# Patient Record
Sex: Female | Born: 1965 | Race: White | Hispanic: No | Marital: Married | Smoking: Never smoker
Health system: Southern US, Community
[De-identification: ages and names within clinical notes are randomized; demographics above are authoritative.]

## PROBLEM LIST (undated history)

## (undated) DIAGNOSIS — R351 Nocturia: Secondary | ICD-10-CM

## (undated) DIAGNOSIS — N393 Stress incontinence (female) (male): Secondary | ICD-10-CM

## (undated) DIAGNOSIS — G971 Other reaction to spinal and lumbar puncture: Secondary | ICD-10-CM

## (undated) DIAGNOSIS — E785 Hyperlipidemia, unspecified: Secondary | ICD-10-CM

## (undated) DIAGNOSIS — I10 Essential (primary) hypertension: Secondary | ICD-10-CM

## (undated) DIAGNOSIS — J189 Pneumonia, unspecified organism: Secondary | ICD-10-CM

## (undated) DIAGNOSIS — D649 Anemia, unspecified: Secondary | ICD-10-CM

## (undated) DIAGNOSIS — J302 Other seasonal allergic rhinitis: Secondary | ICD-10-CM

## (undated) DIAGNOSIS — G43909 Migraine, unspecified, not intractable, without status migrainosus: Secondary | ICD-10-CM

## (undated) DIAGNOSIS — R011 Cardiac murmur, unspecified: Secondary | ICD-10-CM

## (undated) HISTORY — PX: VAGINAL HYSTERECTOMY: SUR661

## (undated) HISTORY — PX: HERNIA REPAIR: SHX51

## (undated) HISTORY — PX: BLADDER SUSPENSION: SHX72

---

## 1987-12-05 DIAGNOSIS — J189 Pneumonia, unspecified organism: Secondary | ICD-10-CM

## 1987-12-05 HISTORY — DX: Pneumonia, unspecified organism: J18.9

## 2001-04-05 HISTORY — PX: TUBAL LIGATION: SHX77

## 2015-10-29 ENCOUNTER — Other Ambulatory Visit (HOSPITAL_COMMUNITY): Payer: Self-pay | Admitting: General Surgery

## 2015-10-29 DIAGNOSIS — K439 Ventral hernia without obstruction or gangrene: Secondary | ICD-10-CM

## 2015-11-06 ENCOUNTER — Ambulatory Visit (HOSPITAL_COMMUNITY)
Admission: RE | Admit: 2015-11-06 | Discharge: 2015-11-06 | Disposition: A | Discharge status: Home / Self Care | Payer: BC Managed Care – PPO | Source: Ambulatory Visit | Attending: General Surgery | Admitting: General Surgery

## 2015-11-06 ENCOUNTER — Encounter (HOSPITAL_COMMUNITY): Payer: Self-pay | Admitting: Radiology

## 2015-11-06 DIAGNOSIS — K439 Ventral hernia without obstruction or gangrene: Secondary | ICD-10-CM | POA: Insufficient documentation

## 2015-11-06 MED ORDER — IOPAMIDOL (ISOVUE-300) INJECTION 61%
100.0000 mL | Freq: Once | INTRAVENOUS | Status: AC | PRN
  Administered 2015-11-06: 100 mL via INTRAVENOUS

## 2016-01-27 ENCOUNTER — Ambulatory Visit: Payer: Self-pay | Admitting: General Surgery

## 2016-01-27 NOTE — H&P (Signed)
Sarah MedicusMarilyn A. Earl Ortega 01/27/2016 2:00 PM Location: [facility] Patient #: [id number] DOB: 04/10/1965 Married / Language: Undefined / Race: Refused to Report/Unreported Female  History of Present Illness Adolph Pollack(Sarah Baskerville J. Henok Heacock MD; 01/27/2016 2:34 PM) The patient is a 50 year old female.   Note:She presents for follow-up visit of her symptomatic and Procardia. The discomfort and symptoms are increasing. She has no obstructive type symptoms. She does not think she can wait until the summer to have the surgery. She is a Chartered loss adjusterschoolteacher. She would like to go ahead and schedule the surgery.  Allergies (April Staton, CMA; 01/27/2016 2:00 PM) Codeine Phosphate *ANALGESICS - OPIOID* Rapid pulse.  Medication History (April Staton, CMA; 01/27/2016 2:00 PM) AmLODIPine Besylate (10MG  Tablet, Oral) Active. Atenolol (25MG  Tablet, Oral) Active. Atorvastatin Calcium (10MG  Tablet, Oral) Active. Cetirizine HCl (10MG  Tablet, Oral) Active. Iron (65MG  Tablet, Oral) Active. Multiple Vitamin (Oral) Active. Calcium (Oral) Specific dose unknown - Active. Medications Reconciled     Review of Systems Adolph Pollack(Dawood Spitler J. Monico Sudduth MD; 01/27/2016 2:34 PM)  Note: No signs of intestinal obstruction.  She does have a borderline hemoglobin A1c level has a family history of diabetes. She has not been diagnosed with diabetes.   Vitals (April Staton CMA; 01/27/2016 2:01 PM) 01/27/2016 2:00 PM Weight: 210.5 lb Height: 64in Height was reported by patient. Body Surface Area: 2 m Body Mass Index: 36.13 kg/m  Temp.: 98.88F(Oral)  Pulse: 75 (Regular)  P.OX: 94% (Room air) BP: 128/90 (Sitting, Left Arm, Standard)      Physical Exam Adolph Pollack(Kasidi Shanker J. Jacai Kipp MD; 01/27/2016 2:36 PM)  The physical exam findings are as follows: Note:General: Obese female in NAD. Pleasant and cooperative.  HEENT: [place]/AT, no external nasal or ear masses  EYES: Wears glasses  CV: RRR, no murmur, no  edema  CHEST: Breath sounds equal and clear. Respirations nonlabored.  ABDOMEN: Soft, nontender, nondistended, lower transverse scar, superior to the scar there is a obvious bulge that is partially reducible.  SKIN: No jaundice.  NEUROLOGIC: Alert and oriented, answers questions appropriately, normal gait and station.  PSYCHIATRIC: Normal mood, affect , and behavior.    Assessment & Plan Adolph Pollack(Lexander Tremblay J. Onnie Alatorre MD; 01/27/2016 2:33 PM)  VENTRAL HERNIA WITHOUT OBSTRUCTION OR GANGRENE (K43.9) Impression: With her increasing discomfort, she does not think she can wait until the summer and would like to schedule the surgery.  Plan: Laparoscopic possible open ventral hernia repair with mesh. I have discussed the procedure, risks, and aftercare. Risks include but are not limited to bleeding, infection, wound healing problems, anesthesia, recurrence, accidental injury to intra-abdominal organs-such as intestine, liver, spleen, bladder, etc. We also discussed the rare complication of mesh rejection. All questions were answered.  Avel Peaceodd Tallula Grindle, M.D.

## 2016-01-28 ENCOUNTER — Ambulatory Visit: Payer: Self-pay | Admitting: General Surgery

## 2016-02-04 ENCOUNTER — Ambulatory Visit: Payer: Self-pay | Admitting: General Surgery

## 2016-03-04 ENCOUNTER — Encounter (HOSPITAL_COMMUNITY): Payer: Self-pay

## 2016-03-04 ENCOUNTER — Encounter (HOSPITAL_COMMUNITY)
Admission: RE | Admit: 2016-03-04 | Discharge: 2016-03-04 | Disposition: A | Discharge status: Home / Self Care | Payer: BC Managed Care – PPO | Source: Ambulatory Visit | Attending: General Surgery | Admitting: General Surgery

## 2016-03-04 DIAGNOSIS — Z01812 Encounter for preprocedural laboratory examination: Secondary | ICD-10-CM | POA: Diagnosis not present

## 2016-03-04 DIAGNOSIS — Z01818 Encounter for other preprocedural examination: Secondary | ICD-10-CM | POA: Insufficient documentation

## 2016-03-04 DIAGNOSIS — Z01810 Encounter for preprocedural cardiovascular examination: Secondary | ICD-10-CM | POA: Insufficient documentation

## 2016-03-04 DIAGNOSIS — K439 Ventral hernia without obstruction or gangrene: Secondary | ICD-10-CM | POA: Diagnosis not present

## 2016-03-04 DIAGNOSIS — R001 Bradycardia, unspecified: Secondary | ICD-10-CM | POA: Insufficient documentation

## 2016-03-04 HISTORY — DX: Essential (primary) hypertension: I10

## 2016-03-04 HISTORY — DX: Other reaction to spinal and lumbar puncture: G97.1

## 2016-03-04 HISTORY — DX: Hyperlipidemia, unspecified: E78.5

## 2016-03-04 HISTORY — DX: Nocturia: R35.1

## 2016-03-04 HISTORY — DX: Stress incontinence (female) (male): N39.3

## 2016-03-04 HISTORY — DX: Pneumonia, unspecified organism: J18.9

## 2016-03-04 HISTORY — DX: Anemia, unspecified: D64.9

## 2016-03-04 LAB — COMPREHENSIVE METABOLIC PANEL
ALBUMIN: 4.3 g/dL (ref 3.5–5.0)
ALK PHOS: 112 U/L (ref 38–126)
ALT: 44 U/L (ref 14–54)
AST: 34 U/L (ref 15–41)
Anion gap: 8 (ref 5–15)
BUN: 12 mg/dL (ref 6–20)
CALCIUM: 9.5 mg/dL (ref 8.9–10.3)
CO2: 27 mmol/L (ref 22–32)
CREATININE: 0.74 mg/dL (ref 0.44–1.00)
Chloride: 104 mmol/L (ref 101–111)
GFR calc Af Amer: >60 mL/min (ref >60)
GLUCOSE: 99 mg/dL (ref 65–99)
Potassium: 4.1 mmol/L (ref 3.5–5.1)
Sodium: 139 mmol/L (ref 135–145)
TOTAL PROTEIN: 6.8 g/dL (ref 6.5–8.1)
Total Bilirubin: 0.4 mg/dL (ref 0.3–1.2)

## 2016-03-04 LAB — CBC WITH DIFFERENTIAL/PLATELET
Basophils Absolute: 0.1 10*3/uL (ref 0.0–0.1)
Basophils Relative: 1 %
Eosinophils Absolute: 0.1 10*3/uL (ref 0.0–0.7)
Eosinophils Relative: 2 %
HCT: 41.7 % (ref 36.0–46.0)
Hemoglobin: 13.8 g/dL (ref 12.0–15.0)
Lymphocytes Relative: 32 %
Lymphs Abs: 2.1 10*3/uL (ref 0.7–4.0)
MCH: 29.6 pg (ref 26.0–34.0)
MCHC: 33.1 g/dL (ref 30.0–36.0)
MCV: 89.5 fL (ref 78.0–100.0)
Monocytes Absolute: 0.7 10*3/uL (ref 0.1–1.0)
Monocytes Relative: 11 %
Neutro Abs: 3.7 10*3/uL (ref 1.7–7.7)
Neutrophils Relative %: 54 %
Platelets: 348 10*3/uL (ref 150–400)
RBC: 4.66 MIL/uL (ref 3.87–5.11)
RDW: 12.9 % (ref 11.5–15.5)
WBC: 6.8 10*3/uL (ref 4.0–10.5)

## 2016-03-04 MED ORDER — CHLORHEXIDINE GLUCONATE CLOTH 2 % EX PADS: 6.0000 | MEDICATED_PAD | Freq: Once | CUTANEOUS | Status: DC

## 2016-03-04 NOTE — Progress Notes (Addendum)
Cardiologist denies  Medical Md is Dr.Vyas  Echo done over 25 yrs ago  Stress test denies  Heart cath denies  EKG denies in past yr  CXR denies in past yr

## 2016-03-04 NOTE — Pre-Procedure Instructions (Signed)
April HoldingMarilyn Whitt  03/04/2016      [facility] - [place] - [address] [place] [id number] Phone: [phone] Fax: [phone]  [facility] [place] - [id number] AT Portal to [facility] [address] [place] [id number] Phone: [phone] Fax: [phone]    Your procedure is scheduled on Mon, Dec 4 @ 11:00 AM  [facility] Admitting at 9:00 AM  Call this number if you have problems the morning of surgery:  [phone]   Remember:  Do not eat food or drink liquids after midnight.  Take these medicines the morning of surgery with A SIP OF WATER Amlodipine(Norvasc),Atenolol(Tenormin),and Zyrtec(Cetirizine)              No Goody's,BC's,Aleve,Advil,Motrin,Ibuprofen,Fish Oil,or any Herbal Medications.    Do not wear jewelry, make-up or nail polish.  Do not wear lotions, powders, perfumes, or deoderant.  Do not shave 48 hours prior to surgery.    Do not bring valuables to the hospital.  [facility] is not responsible for any belongings or valuables.  Contacts, dentures or bridgework may not be worn into surgery.  Leave your suitcase in the car.  After surgery it may be brought to your room.  For patients admitted to the hospital, discharge time will be determined by your treatment team.  Patients discharged the day of surgery will not be allowed to drive home.    [facility]  Before surgery, you can play an important role.  Because skin is not sterile, your skin needs to be as free of germs as possible.  You can reduce the number of germs on you skin by washing with CHG (chlorahexidine gluconate) soap before surgery.  CHG is an antiseptic cleaner which kills germs and bonds with the skin to continue killing germs even after washing.  Please DO NOT use if you have an allergy to CHG or antibacterial soaps.  If your skin  becomes reddened/irritated stop using the CHG and inform your nurse when you arrive at Short Stay.  Do not shave (including legs and underarms) for at least 48 hours prior to the first CHG shower.  You may shave your face.  Please follow these instructions carefully:   1.  Shower with CHG Soap the night before surgery and the                                morning of Surgery.  2.  If you choose to wash your hair, wash your hair first as usual with your       normal shampoo.  3.  After you shampoo, rinse your hair and body thoroughly to remove the                      Shampoo.  4.  Use CHG as you would any other liquid soap.  You can apply chg directly       to the skin and wash gently with scrungie or a clean washcloth.  5.  Apply the CHG Soap to your body ONLY FROM THE NECK DOWN.        Do not use on open wounds or open sores.  Avoid contact with your eyes,       ears, mouth and genitals (private parts).  Wash genitals (private parts)       with your normal soap.  6.  Wash thoroughly, paying special attention to the area where your surgery        will be performed.  7.  Thoroughly rinse your body with warm water from the neck down.  8.  DO NOT shower/wash with your normal soap after using and rinsing off       the CHG Soap.  9.  Pat yourself dry with a clean towel.            10.  Wear clean pajamas.            11.  Place clean sheets on your bed the night of your first shower and do not        sleep with pets.  Day of Surgery  Do not apply any lotions/deoderants the morning of surgery.  Please wear clean clothes to the hospital/surgery center.   Please read over the following fact sheets that you were given. Pain Booklet, Coughing and Deep Breathing and Surgical Site Infection Prevention

## 2016-03-05 LAB — HEMOGLOBIN A1C
HEMOGLOBIN A1C: 5.9 % — AB (ref 4.8–5.6)
MEAN PLASMA GLUCOSE: 123 mg/dL

## 2016-03-08 ENCOUNTER — Observation Stay (HOSPITAL_COMMUNITY)
Admission: RE | Admit: 2016-03-08 | Discharge: 2016-03-10 | Disposition: A | Discharge status: Home / Self Care | Payer: BC Managed Care – PPO | Source: Ambulatory Visit | Attending: General Surgery | Admitting: General Surgery

## 2016-03-08 ENCOUNTER — Encounter (HOSPITAL_COMMUNITY)
Admission: RE | Disposition: A | Discharge status: Home / Self Care | Payer: Self-pay | Source: Ambulatory Visit | Attending: General Surgery

## 2016-03-08 ENCOUNTER — Encounter (HOSPITAL_COMMUNITY): Payer: Self-pay | Admitting: *Deleted

## 2016-03-08 ENCOUNTER — Ambulatory Visit (HOSPITAL_COMMUNITY): Payer: BC Managed Care – PPO | Admitting: Anesthesiology

## 2016-03-08 DIAGNOSIS — I10 Essential (primary) hypertension: Secondary | ICD-10-CM | POA: Diagnosis not present

## 2016-03-08 DIAGNOSIS — K436 Other and unspecified ventral hernia with obstruction, without gangrene: Secondary | ICD-10-CM | POA: Diagnosis present

## 2016-03-08 DIAGNOSIS — E785 Hyperlipidemia, unspecified: Secondary | ICD-10-CM | POA: Insufficient documentation

## 2016-03-08 HISTORY — PX: INSERTION OF MESH: SHX5868

## 2016-03-08 HISTORY — DX: Other seasonal allergic rhinitis: J30.2

## 2016-03-08 HISTORY — PX: VENTRAL HERNIA REPAIR: SHX424

## 2016-03-08 HISTORY — DX: Migraine, unspecified, not intractable, without status migrainosus: G43.909

## 2016-03-08 HISTORY — DX: Cardiac murmur, unspecified: R01.1

## 2016-03-08 HISTORY — PX: LAPAROSCOPIC INCISIONAL / UMBILICAL / VENTRAL HERNIA REPAIR: SUR789

## 2016-03-08 SURGERY — REPAIR, HERNIA, VENTRAL, LAPAROSCOPIC
Anesthesia: General | Site: Abdomen

## 2016-03-08 MED ORDER — DIPHENHYDRAMINE HCL 50 MG/ML IJ SOLN: 25.0000 mg | Freq: Four times a day (QID) | INTRAMUSCULAR | Status: DC | PRN

## 2016-03-08 MED ORDER — FENTANYL CITRATE (PF) 100 MCG/2ML IJ SOLN
INTRAMUSCULAR | Status: DC | PRN
  Administered 2016-03-08: 150 ug via INTRAVENOUS
  Administered 2016-03-08: 50 ug via INTRAVENOUS

## 2016-03-08 MED ORDER — DIPHENHYDRAMINE HCL 25 MG PO CAPS: 25.0000 mg | ORAL_CAPSULE | ORAL | Status: DC | PRN

## 2016-03-08 MED ORDER — ONDANSETRON HCL 4 MG/2ML IJ SOLN
INTRAMUSCULAR | Status: DC | PRN
  Administered 2016-03-08: 4 mg via INTRAVENOUS

## 2016-03-08 MED ORDER — HYDROMORPHONE HCL 1 MG/ML IJ SOLN
0.2500 mg | INTRAMUSCULAR | Status: AC | PRN
  Administered 2016-03-08 (×4): 0.5 mg via INTRAVENOUS

## 2016-03-08 MED ORDER — EPHEDRINE SULFATE 50 MG/ML IJ SOLN
INTRAMUSCULAR | Status: DC | PRN
  Administered 2016-03-08 (×2): 5 mg via INTRAVENOUS
  Administered 2016-03-08 (×2): 10 mg via INTRAVENOUS

## 2016-03-08 MED ORDER — LIDOCAINE HCL (CARDIAC) 20 MG/ML IV SOLN
INTRAVENOUS | Status: DC | PRN
  Administered 2016-03-08: 100 mg via INTRATRACHEAL

## 2016-03-08 MED ORDER — SUGAMMADEX SODIUM 500 MG/5ML IV SOLN
INTRAVENOUS | Status: DC | PRN
  Administered 2016-03-08: 200 mg via INTRAVENOUS

## 2016-03-08 MED ORDER — FERROUS SULFATE 325 (65 FE) MG PO TABS
325.0000 mg | ORAL_TABLET | Freq: Every day | ORAL | Status: DC
  Administered 2016-03-09 – 2016-03-10 (×2): 325 mg via ORAL
  Filled 2016-03-08 (×2): qty 1

## 2016-03-08 MED ORDER — HYDROMORPHONE HCL 2 MG/ML IJ SOLN
0.5000 mg | INTRAMUSCULAR | Status: DC | PRN
  Administered 2016-03-08 (×2): 1 mg via INTRAVENOUS
  Filled 2016-03-08 (×2): qty 1

## 2016-03-08 MED ORDER — FENTANYL CITRATE (PF) 100 MCG/2ML IJ SOLN
INTRAMUSCULAR | Status: AC
  Filled 2016-03-08: qty 4

## 2016-03-08 MED ORDER — SODIUM CHLORIDE 0.9 % IR SOLN
Status: DC | PRN
  Administered 2016-03-08: 1000 mL

## 2016-03-08 MED ORDER — KETOROLAC TROMETHAMINE 30 MG/ML IJ SOLN
30.0000 mg | Freq: Four times a day (QID) | INTRAMUSCULAR | Status: AC
  Administered 2016-03-08 – 2016-03-09 (×6): 30 mg via INTRAVENOUS
  Filled 2016-03-08 (×5): qty 1

## 2016-03-08 MED ORDER — HEPARIN SODIUM (PORCINE) 5000 UNIT/ML IJ SOLN
5000.0000 [IU] | Freq: Three times a day (TID) | INTRAMUSCULAR | Status: DC
  Filled 2016-03-08 (×2): qty 1

## 2016-03-08 MED ORDER — HYDROMORPHONE HCL 1 MG/ML IJ SOLN: 0.2500 mg | INTRAMUSCULAR | Status: DC | PRN

## 2016-03-08 MED ORDER — CEFAZOLIN SODIUM-DEXTROSE 2-4 GM/100ML-% IV SOLN
2.0000 g | INTRAVENOUS | Status: AC
  Administered 2016-03-08: 2 g via INTRAVENOUS

## 2016-03-08 MED ORDER — ATENOLOL 25 MG PO TABS
12.5000 mg | ORAL_TABLET | Freq: Every day | ORAL | Status: DC
  Administered 2016-03-10: 12.5 mg via ORAL
  Filled 2016-03-08 (×2): qty 1

## 2016-03-08 MED ORDER — CEFAZOLIN SODIUM-DEXTROSE 2-4 GM/100ML-% IV SOLN
INTRAVENOUS | Status: AC
  Filled 2016-03-08: qty 100

## 2016-03-08 MED ORDER — ROCURONIUM BROMIDE 10 MG/ML (PF) SYRINGE
PREFILLED_SYRINGE | INTRAVENOUS | Status: AC
  Filled 2016-03-08: qty 10

## 2016-03-08 MED ORDER — BUPIVACAINE HCL (PF) 0.5 % IJ SOLN
INTRAMUSCULAR | Status: AC
  Filled 2016-03-08: qty 30

## 2016-03-08 MED ORDER — KCL IN DEXTROSE-NACL 20-5-0.9 MEQ/L-%-% IV SOLN
INTRAVENOUS | Status: DC
  Administered 2016-03-08 – 2016-03-09 (×2): via INTRAVENOUS
  Filled 2016-03-08 (×3): qty 1000

## 2016-03-08 MED ORDER — BUPIVACAINE-EPINEPHRINE (PF) 0.25% -1:200000 IJ SOLN
INTRAMUSCULAR | Status: AC
  Filled 2016-03-08: qty 30

## 2016-03-08 MED ORDER — BUPIVACAINE HCL (PF) 0.5 % IJ SOLN
INTRAMUSCULAR | Status: DC | PRN
  Administered 2016-03-08: 14 mL

## 2016-03-08 MED ORDER — PROPOFOL 10 MG/ML IV BOLUS
INTRAVENOUS | Status: AC
Start: 2016-03-08 — End: 2016-03-08
  Filled 2016-03-08: qty 20

## 2016-03-08 MED ORDER — LACTATED RINGERS IV SOLN
INTRAVENOUS | Status: DC
  Administered 2016-03-08 (×2): via INTRAVENOUS

## 2016-03-08 MED ORDER — HYDROMORPHONE HCL 1 MG/ML IJ SOLN
INTRAMUSCULAR | Status: AC
  Filled 2016-03-08: qty 1

## 2016-03-08 MED ORDER — OXYCODONE HCL 5 MG PO TABS
5.0000 mg | ORAL_TABLET | ORAL | Status: DC | PRN
  Administered 2016-03-08 – 2016-03-10 (×10): 10 mg via ORAL
  Filled 2016-03-08 (×2): qty 2
  Filled 2016-03-08: qty 1
  Filled 2016-03-08 (×7): qty 2
  Filled 2016-03-08: qty 1

## 2016-03-08 MED ORDER — CEFAZOLIN SODIUM-DEXTROSE 2-4 GM/100ML-% IV SOLN
2.0000 g | Freq: Three times a day (TID) | INTRAVENOUS | Status: AC
  Administered 2016-03-08: 2 g via INTRAVENOUS
  Filled 2016-03-08: qty 100

## 2016-03-08 MED ORDER — LIDOCAINE 2% (20 MG/ML) 5 ML SYRINGE
INTRAMUSCULAR | Status: AC
  Filled 2016-03-08: qty 5

## 2016-03-08 MED ORDER — METHOCARBAMOL 500 MG PO TABS
500.0000 mg | ORAL_TABLET | Freq: Four times a day (QID) | ORAL | Status: DC
  Administered 2016-03-08 – 2016-03-10 (×10): 500 mg via ORAL
  Filled 2016-03-08 (×10): qty 1

## 2016-03-08 MED ORDER — MIDAZOLAM HCL 2 MG/2ML IJ SOLN
INTRAMUSCULAR | Status: AC
  Filled 2016-03-08: qty 2

## 2016-03-08 MED ORDER — METHOCARBAMOL 500 MG PO TABS
ORAL_TABLET | ORAL | Status: AC
  Filled 2016-03-08: qty 1

## 2016-03-08 MED ORDER — ROCURONIUM 10MG/ML (10ML) SYRINGE FOR MEDFUSION PUMP - OPTIME
INTRAVENOUS | Status: DC | PRN
  Administered 2016-03-08: 10 mg via INTRAVENOUS
  Administered 2016-03-08: 50 mg via INTRAVENOUS

## 2016-03-08 MED ORDER — MIDAZOLAM HCL 2 MG/2ML IJ SOLN
INTRAMUSCULAR | Status: DC | PRN
  Administered 2016-03-08: 2 mg via INTRAVENOUS

## 2016-03-08 MED ORDER — EPHEDRINE 5 MG/ML INJ
INTRAVENOUS | Status: AC
  Filled 2016-03-08: qty 10

## 2016-03-08 MED ORDER — PROPOFOL 10 MG/ML IV BOLUS
INTRAVENOUS | Status: DC | PRN
  Administered 2016-03-08: 150 mg via INTRAVENOUS

## 2016-03-08 MED ORDER — AMLODIPINE BESYLATE 5 MG PO TABS
5.0000 mg | ORAL_TABLET | Freq: Every evening | ORAL | Status: DC
  Administered 2016-03-09 – 2016-03-10 (×2): 5 mg via ORAL
  Filled 2016-03-08 (×3): qty 1

## 2016-03-08 MED ORDER — ONDANSETRON HCL 4 MG/2ML IJ SOLN: 4.0000 mg | Freq: Once | INTRAMUSCULAR | Status: DC | PRN

## 2016-03-08 MED ORDER — ONDANSETRON HCL 4 MG/2ML IJ SOLN
INTRAMUSCULAR | Status: AC
  Filled 2016-03-08: qty 2

## 2016-03-08 MED ORDER — MEPERIDINE HCL 25 MG/ML IJ SOLN: 6.2500 mg | INTRAMUSCULAR | Status: DC | PRN

## 2016-03-08 MED ORDER — KETOROLAC TROMETHAMINE 30 MG/ML IJ SOLN
INTRAMUSCULAR | Status: AC
  Filled 2016-03-08: qty 1

## 2016-03-08 SURGICAL SUPPLY — 49 items
APPLIER CLIP LOGIC TI 5 (MISCELLANEOUS) IMPLANT
BANDAGE ADH SHEER 1  50/CT (GAUZE/BANDAGES/DRESSINGS) IMPLANT
BENZOIN TINCTURE PRP APPL 2/3 (GAUZE/BANDAGES/DRESSINGS) ×4 IMPLANT
BINDER ABD UNIV 10 28-50 (GAUZE/BANDAGES/DRESSINGS) ×1 IMPLANT
BINDER ABDOM UNIV 10 (GAUZE/BANDAGES/DRESSINGS) ×2
BLADE SURG ROTATE 9660 (MISCELLANEOUS) IMPLANT
CANISTER SUCTION 2500CC (MISCELLANEOUS) IMPLANT
CHLORAPREP W/TINT 26ML (MISCELLANEOUS) ×2 IMPLANT
CLSR STERI-STRIP ANTIMIC 1/2X4 (GAUZE/BANDAGES/DRESSINGS) ×2 IMPLANT
COVER SURGICAL LIGHT HANDLE (MISCELLANEOUS) ×2 IMPLANT
DEVICE TROCAR PUNCTURE CLOSURE (ENDOMECHANICALS) ×2 IMPLANT
DISSECTOR BLUNT TIP ENDO 5MM (MISCELLANEOUS) IMPLANT
DRAPE INCISE IOBAN 66X45 STRL (DRAPES) ×4 IMPLANT
DRAPE LAPAROSCOPIC ABDOMINAL (DRAPES) ×2 IMPLANT
DRSG TEGADERM 2-3/8X2-3/4 SM (GAUZE/BANDAGES/DRESSINGS) ×10 IMPLANT
ELECT REM PT RETURN 9FT ADLT (ELECTROSURGICAL) ×2
ELECTRODE REM PT RTRN 9FT ADLT (ELECTROSURGICAL) ×1 IMPLANT
GAUZE SPONGE 2X2 8PLY STRL LF (GAUZE/BANDAGES/DRESSINGS) ×1 IMPLANT
GLOVE BIOGEL M 8.0 STRL (GLOVE) ×2 IMPLANT
GLOVE BIOGEL PI IND STRL 8 (GLOVE) ×2 IMPLANT
GLOVE BIOGEL PI INDICATOR 8 (GLOVE) ×2
GLOVE ECLIPSE 8.0 STRL XLNG CF (GLOVE) ×2 IMPLANT
GOWN STRL REUS W/ TWL LRG LVL3 (GOWN DISPOSABLE) ×2 IMPLANT
GOWN STRL REUS W/TWL LRG LVL3 (GOWN DISPOSABLE) ×2
KIT BASIN OR (CUSTOM PROCEDURE TRAY) ×2 IMPLANT
KIT ROOM TURNOVER OR (KITS) ×2 IMPLANT
MARKER SKIN DUAL TIP RULER LAB (MISCELLANEOUS) ×2 IMPLANT
MESH VENTRALIGHT ST 6X8 (Mesh Specialty) ×1 IMPLANT
MESH VENTRLGHT ELLIPSE 8X6XMFL (Mesh Specialty) ×1 IMPLANT
NEEDLE SPNL 22GX3.5 QUINCKE BK (NEEDLE) ×2 IMPLANT
NS IRRIG 1000ML POUR BTL (IV SOLUTION) ×2 IMPLANT
PAD ARMBOARD 7.5X6 YLW CONV (MISCELLANEOUS) ×4 IMPLANT
SCALPEL HARMONIC ACE (MISCELLANEOUS) IMPLANT
SCISSORS LAP 5X35 DISP (ENDOMECHANICALS) ×2 IMPLANT
SET IRRIG TUBING LAPAROSCOPIC (IRRIGATION / IRRIGATOR) IMPLANT
SLEEVE ENDOPATH XCEL 5M (ENDOMECHANICALS) ×4 IMPLANT
SPONGE GAUZE 2X2 STER 10/PKG (GAUZE/BANDAGES/DRESSINGS) ×1
SUT MON AB 4-0 PC3 18 (SUTURE) ×2 IMPLANT
SUT NOVA NAB DX-16 0-1 5-0 T12 (SUTURE) ×2 IMPLANT
SUT VICRYL 0 TIES 12 18 (SUTURE) IMPLANT
SUT VICRYL 0 UR6 27IN ABS (SUTURE) IMPLANT
TACKER 5MM HERNIA 3.5CML NAB (ENDOMECHANICALS) ×4 IMPLANT
TOWEL OR 17X24 6PK STRL BLUE (TOWEL DISPOSABLE) ×2 IMPLANT
TOWEL OR 17X26 10 PK STRL BLUE (TOWEL DISPOSABLE) ×2 IMPLANT
TRAY FOLEY CATH 14FRSI W/METER (CATHETERS) IMPLANT
TRAY LAPAROSCOPIC MC (CUSTOM PROCEDURE TRAY) ×2 IMPLANT
TROCAR XCEL NON-BLD 11X100MML (ENDOMECHANICALS) ×2 IMPLANT
TROCAR XCEL NON-BLD 5MMX100MML (ENDOMECHANICALS) ×2 IMPLANT
TUBING INSUFFLATION (TUBING) ×2 IMPLANT

## 2016-03-08 NOTE — Interval H&P Note (Signed)
History and Physical Interval Note:  03/08/2016 10:52 AM  Sarah Ortega  has presented today for surgery, with the diagnosis of ventral hernia  The various methods of treatment have been discussed with the patient and family. After consideration of risks, benefits and other options for treatment, the patient has consented to  Procedure(s): LAPAROSCOPIC VENTRAL HERNIA (N/A) INSERTION OF MESH (N/A) as a surgical intervention .  The patient's history has been reviewed, patient examined, no change in status, stable for surgery.  I have reviewed the patient's chart and labs.  Questions were answered to the patient's satisfaction.     Miia Blanks JShela Commons

## 2016-03-08 NOTE — Op Note (Signed)
Operative Note  Sarah Ortega female 50 y.o. April Holding12/07/2013  PREOPERATIVE DX:  Ventral hernia  POSTOPERATIVE DX:  Chronically incarcerated ventral hernia (omentum)  PROCEDURE:   Laparoscopic repair of chronically incarcerated ventral hernia with mesh (Ventralite 15 cm by 20 cm)         Surgeon: Adolph PollackOSENBOWER,Marcanthony Sleight J   Assistants: Harriette Bouillonhomas Cornett M.D.  Anesthesia: General endotracheal anesthesia  Indications:   This is a 50 year old female who has an enlarging and increasingly symptomatic (pain) right paramedian lower midline ventral hernia containing omentum. She now presents for repair.    Procedure Detail:  She was brought to the operating room placed supine on the operating table in the general anesthetic was given. A Foley catheter was inserted. An oral gastric tube was inserted. The abdominal wall and groin areas were widely sterilely prepped and draped. A timeout was performed.  She was placed in slight reverse Trendelenburg position. A 5 mm incision was made in the left upper quadrant subcostal area. Using a 5 mm Optiview trocar and laparoscope, access was gained to the peritoneal cavity and pneumoperitoneum was created. Inspection of the area under the laparoscope demonstrated no evidence of bleeding or organ injury.  A 5 mm trocar was placed in the left mid abdomen. The hernia was identified with omentum up in the hernia the lower midline and off to the right side. I began starting to reduce the omentum which was chronically incarcerated. An 11 mm trocar was then placed in the right upper quadrant to further help this process. Using blunt dissection and sharp dissection I reduced the old omentum from the hernia defect. Approximately 25-30% of her omentum was up in the defect. The omentum was inspected and there is no evidence of bleeding.  The edges of the defect were identified in 4 quadrants using a spinal needle. I then measured 4-5 cm away from these areas. A piece of mesh to  allow for coverage and adequate defect measured 15 cm x 20 cm and thus a piece of Ventralite mesh of that size was brought into the field. 4 anchoring sutures of #1 Novafil were placed around the periphery of the mesh at the 12, 3, 6, and 9:00 positions. The mesh was hydrated and then placed into the abdominal cavity through the 11 mm trocar. The mesh was deployed so that the smooth, nonadherent side was facing the viscera and the rough side was facing the abdominal wall. 4 small incisions were made in the lower abdominal wall to correlate with the anchoring sutures. Anchor sutures were then brought up across the fascial bridge and tied down anchoring the mesh to the abdominal wall.  A 5 mm trocar was then placed in the right lateral abdomen.  I then further anchored to the periphery of the mesh to the abdominal wall with spiral tacks. I then put an inner layer of spiral tacks as well. This provided for good coverage with adequate overlap of the hernia defect.  Following this, a 4 quadrant and central inspection were performed. There was no evidence of bleeding or organ injury. The pneumoperitoneum was released and I watched the viscera approximate the nonadherent barrier side of the mesh. All trocars were then removed.  Skin incisions were closed with 4-0 Monocryl subcuticular stitches followed by Steri-Strips and sterile dressings.  She tolerated the procedure well without any apparent complications and was taken to the recovery room in satisfactory condition.  Estimated Blood Loss:  100 ml         Drains:  none  Blood Given: none          Specimens: none        Complications:  * No complications entered in OR log *         Disposition: PACU - hemodynamically stable.         Condition: stable

## 2016-03-08 NOTE — H&P (Signed)
Sarah HoldingMarilyn Ortega is an 50 y.o. female.   Chief Complaint:  She presents for elective ventral hernia repair. HPI:   She has an enlarging right lower paramedian ventral hernia that is increasingly symptomatic.  She now presents for elective repair.  Past Medical History:  Diagnosis Date  . Allergy    takes Zyrtec daily  . Anemia    takes Ferrous Sulfate daily  . History of bronchitis    7-8 yrs ago  . Hyperlipidemia    takes Atorvastatin daily  . Hypertension    takes Atenolol and Amlodipine daily  . Nocturia   . Pneumonia    hx of-yrs ago(18+ yr ago)  . Spinal headache    after C/S did not require blood patch  . Stress incontinence     Past Surgical History:  Procedure Laterality Date  . bladder suspension redo    . CESAREAN SECTION    . PARTIAL HYSTERECTOMY     with bladder suspension    History reviewed. No pertinent family history. Social History:  reports that she has never smoked. She has never used smokeless tobacco. She reports that she does not drink alcohol or use drugs.  Allergies:  Allergies  Allergen Reactions  . Codeine Other (See Comments)    Causes Rapid Heart Rate and sweating  . Morphine And Related Itching    Medications Prior to Admission  Medication Sig Dispense Refill  . amLODipine (NORVASC) 10 MG tablet Take 5 mg by mouth every evening.    Marland Kitchen. atenolol (TENORMIN) 25 MG tablet Take 12.5 mg by mouth daily.    Marland Kitchen. atorvastatin (LIPITOR) 10 MG tablet Take 10 mg by mouth daily.    Marland Kitchen. CALCIUM-MAGNESIUM-VITAMIN D PO Take 1 tablet by mouth daily.    . cetirizine (ZYRTEC) 10 MG tablet Take 10 mg by mouth daily.    . Ferrous Sulfate (IRON) 325 (65 Fe) MG TABS Take 1 tablet by mouth daily.    . Multiple Vitamins-Minerals (MULTIVITAMIN WITH MINERALS) tablet Take 1 tablet by mouth daily.      No results found for this or any previous visit (from the past 48 hour(s)). No results found.  Review of Systems  Constitutional: Negative for chills and fever.   HENT: Negative for sore throat.   Respiratory: Negative for cough.   Gastrointestinal: Negative for diarrhea and nausea.  Genitourinary: Negative for dysuria.    Blood pressure (!) 144/73, pulse 64, temperature 98.6 F (37 C), temperature source Oral, resp. rate 18, height 5\' 3"  (1.6 m), weight 96.6 kg (213 lb), SpO2 100 %. Physical Exam  Constitutional:  Overweight female in NAD.  Her husband is with her.  HENT:  Head: Normocephalic and atraumatic.  Cardiovascular: Normal rate.   Respiratory: Effort normal and breath sounds normal.  GI: Soft.  Lower transverse scar.  Right lower midline bulge.  Musculoskeletal: She exhibits no edema.  Neurological: She is alert.  Skin: Skin is warm and dry.  Psychiatric: She has a normal mood and affect. Her behavior is normal.     Assessment/Plan Increasingly symptomatic ventral hernia  Plan:  Laparoscopic repair with mesh.  Adolph PollackOSENBOWER,Amin Fornwalt J, MD 03/08/2016, 10:43 AM

## 2016-03-08 NOTE — Discharge Instructions (Signed)
[facility] _____[facility]   HERNIA REPAIR: POST OP INSTRUCTIONS  Always review your discharge instruction sheet given to you by the facility where your surgery was performed. IF YOU HAVE DISABILITY OR FAMILY LEAVE FORMS, YOU MUST BRING THEM TO THE OFFICE FOR PROCESSING.   DO NOT GIVE THEM TO YOUR DOCTOR.  1. A  prescription for pain medication may be given to you upon discharge.  Take your pain medication as prescribed, if needed.  If narcotic pain medicine is not needed, then you may take acetaminophen (Tylenol) or ibuprofen (Advil) as needed. 2. Take your usually prescribed medications unless otherwise directed. 3. If you need a refill on your pain medication, please contact your pharmacy.  They will contact our office to request authorization. Prescriptions will not be filled after 5 pm or on week-ends. 4. You should follow a light diet the first 24 hours after arrival home, such as soup and crackers, etc.  Be sure to include lots of fluids daily.  Resume your normal diet the day after surgery. 5. Most patients will experience some swelling and bruising around the hernia repair site.  Ice packs and reclining will help.  Swelling and bruising can take several days to resolve.  6. It is common to experience some constipation if taking pain medication after surgery.  Increasing fluid intake and taking a stool softener (such as Colace) will usually help or prevent this problem from occurring.  A mild laxative (Milk of Magnesia or Miralax) should be taken according to package directions if there are no bowel movements after 48 hours. 7. Unless discharge instructions indicate otherwise, you may remove your bandages 72 hours after surgery.  You may shower the day after surgery.  You may have steri-strips (small skin tapes) in place directly over the incision.  These strips should be left on the skin until they fall off.  If your surgeon used skin glue on the incision, you may shower in 24  hours.  The glue will flake off over the next 2-3 weeks.  Any sutures or staples will be removed at the office during your follow-up visit. 8. ACTIVITIES:  You may resume light daily activities beginning the next day--such as daily self-care, walking, climbing stairs--gradually increasing activities as tolerated. Do not lie flat for the first 2-3 days. You may have sexual intercourse when it is comfortable.  Refrain from any heavy lifting or straining-nothing over 10 pounds for 6 weeks.   a. You may drive when you are no longer taking prescription pain medication, you can comfortably wear a seatbelt, and you can safely maneuver your car and apply brakes. b. RETURN TO WORK:  _Desk type work in one week, full duty in 6 weeks._________________________________________________________ 9. You should see your doctor in the office for a follow-up appointment approximately 2-3 weeks after your surgery.  Make sure that you call for this appointment within a day or two after you arrive home to insure a convenient appointment time. 10. OTHER INSTRUCTIONS:  _Wear abdominal binder.  Do not lie flat._________________________________________________________________________________________________________________________________________________________________________________________  WHEN TO CALL YOUR DOCTOR: 1. Fever over 101.0 2. Inability to urinate 3. Nausea and/or vomiting 4. Extreme swelling or bruising 5. Continued bleeding from incision. 6. Increased pain, redness, or drainage from the incision  The clinic staff is available to answer your questions during regular business hours.  Please dont hesitate to call and ask to speak to one of the nurses for clinical concerns.  If you have a medical emergency, go to  the nearest emergency room or call 911.  A surgeon from [facility] is always on call at the hospital   [address], [place], [place]  [address] ?  [address] [address],  [place], [place]   [address] [phone] ? [phone] ? FAX [phone] Web site: [email or web address]

## 2016-03-08 NOTE — Anesthesia Preprocedure Evaluation (Signed)
Anesthesia Evaluation  Patient identified by MRN, date of birth, ID band Patient awake    Reviewed: Allergy & Precautions, NPO status , Patient's Chart, lab work & pertinent test results  Airway Mallampati: I  TM Distance: >3 FB Neck ROM: Full    Dental   Pulmonary    Pulmonary exam normal        Cardiovascular hypertension, Pt. on medications Normal cardiovascular exam     Neuro/Psych    GI/Hepatic   Endo/Other    Renal/GU      Musculoskeletal   Abdominal   Peds  Hematology   Anesthesia Other Findings   Reproductive/Obstetrics                             Anesthesia Physical Anesthesia Plan  ASA: II  Anesthesia Plan: General   Post-op Pain Management:    Induction: Intravenous  Airway Management Planned: Oral ETT  Additional Equipment:   Intra-op Plan:   Post-operative Plan: Extubation in OR  Informed Consent: I have reviewed the patients History and Physical, chart, labs and discussed the procedure including the risks, benefits and alternatives for the proposed anesthesia with the patient or authorized representative who has indicated his/her understanding and acceptance.     Plan Discussed with: CRNA and Surgeon  Anesthesia Plan Comments:         Anesthesia Quick Evaluation  

## 2016-03-08 NOTE — Transfer of Care (Signed)
Immediate Anesthesia Transfer of Care Note  Patient: Sarah Ortega  Procedure(s) Performed: Procedure(s): LAPAROSCOPIC VENTRAL HERNIA (N/A) INSERTION OF MESH (N/A)  Patient Location: PACU  Anesthesia Type:General  Level of Consciousness: awake, alert  and patient cooperative  Airway & Oxygen Therapy: Patient Spontanous Breathing  Post-op Assessment: Report given to RN and Post -op Vital signs reviewed and stable  Post vital signs: Reviewed and stable  Last Vitals:  Vitals:   03/08/16 1005  BP: (!) 144/73  Pulse: 64  Resp: 18  Temp: 37 C    Last Pain:  Vitals:   03/08/16 1005  TempSrc: Oral      Patients Stated Pain Goal: 4 (03/08/16 0950)  Complications: No apparent anesthesia complications

## 2016-03-08 NOTE — Anesthesia Procedure Notes (Signed)
Procedure Name: Intubation Date/Time: 03/08/2016 11:05 AM Performed by: Rosiland OzMEYERS, Rathana Viveros Pre-anesthesia Checklist: Patient identified, Emergency Drugs available, Suction available, Patient being monitored and Timeout performed Patient Re-evaluated:Patient Re-evaluated prior to inductionOxygen Delivery Method: Circle system utilized Preoxygenation: Pre-oxygenation with 100% oxygen Intubation Type: IV induction Ventilation: Mask ventilation without difficulty Laryngoscope Size: Miller and 3 Grade View: Grade I Tube type: Oral Tube size: 7.0 mm Number of attempts: 1 Airway Equipment and Method: Stylet Placement Confirmation: ETT inserted through vocal cords under direct vision,  positive ETCO2 and breath sounds checked- equal and bilateral Secured at: 22 cm Tube secured with: Tape Dental Injury: Teeth and Oropharynx as per pre-operative assessment  Comments: Airway per Lynn ItoN. Cannon, SRNA

## 2016-03-08 NOTE — Progress Notes (Signed)
Patient arrived from PACU alert and oriented. Received bedside report from PACU nurse. Patient has ab binder with ice on, IV fluids running. No family present at the moment, PACU nurse said she would call her husband and tell him to come up. No complaints at this time, mild pain.

## 2016-03-08 NOTE — Anesthesia Postprocedure Evaluation (Signed)
Anesthesia Post Note  Patient: April HoldingMarilyn Bless  Procedure(s) Performed: Procedure(s) (LRB): LAPAROSCOPIC VENTRAL HERNIA (N/A) INSERTION OF MESH (N/A)  Patient location during evaluation: PACU Anesthesia Type: General Level of consciousness: awake and alert Pain management: pain level controlled Vital Signs Assessment: post-procedure vital signs reviewed and stable Respiratory status: spontaneous breathing, nonlabored ventilation, respiratory function stable and patient connected to nasal cannula oxygen Cardiovascular status: blood pressure returned to baseline and stable Postop Assessment: no signs of nausea or vomiting Anesthetic complications: no    Last Vitals:  Vitals:   03/08/16 1431 03/08/16 1733  BP: 113/64 111/67  Pulse: (!) 58   Resp: 15   Temp: 36.9 C     Last Pain:  Vitals:   03/08/16 1732  TempSrc:   PainSc: 6                  Yasmeen Manka DAVID

## 2016-03-09 ENCOUNTER — Encounter (HOSPITAL_COMMUNITY): Payer: Self-pay | Admitting: General Surgery

## 2016-03-09 DIAGNOSIS — K436 Other and unspecified ventral hernia with obstruction, without gangrene: Secondary | ICD-10-CM | POA: Diagnosis not present

## 2016-03-09 MED ORDER — OXYCODONE HCL 5 MG PO TABS: 5.0000 mg | ORAL_TABLET | ORAL | 0 refills | Status: AC | PRN

## 2016-03-09 MED ORDER — ACETAMINOPHEN 325 MG PO TABS
650.0000 mg | ORAL_TABLET | ORAL | Status: DC | PRN
  Administered 2016-03-09: 650 mg via ORAL
  Filled 2016-03-09: qty 2

## 2016-03-09 NOTE — Progress Notes (Signed)
Assessment Active Problems:   Chronically incarcerated ventral hernia s/p laparoscopic repair with mesh 03/08/16-still requiring IV analgesics to control pain.   Plan:  Not ready for discharge until pain control with oral analgesic is better.   LOS: 0 days     1 Day Post-Op  Subjective: Very sore.  Needs IV Dilaudid for pain control.  Has voided.  Husband in room.  Objective: Vital signs in last 24 hours: Temp:  [97.7 F (36.5 C)-98.6 F (37 C)] 97.8 F (36.6 C) (12/05 0647) Pulse Rate:  [58-77] 70 (12/05 0647) Resp:  [10-19] 19 (12/05 0647) BP: (107-147)/(50-77) 107/50 (12/05 0647) SpO2:  [90 %-100 %] 90 % (12/05 0647) Weight:  [96.6 kg (213 lb)-96.8 kg (213 lb 6 oz)] 96.6 kg (213 lb) (12/04 1005) Last BM Date: 03/08/16  Intake/Output from previous day: 12/04 0701 - 12/05 0700 In: 2473 [P.O.:240; I.V.:2233] Out: 445 [Urine:425; Blood:20] Intake/Output this shift: Total I/O In: -  Out: 400 [Urine:400]  PE: General- In NAD Abdomen-soft, active bowel sounds, dressings dry  Lab Results:  No results for input(s): WBC, HGB, HCT, PLT in the last 72 hours. BMET No results for input(s): NA, K, CL, CO2, GLUCOSE, BUN, CREATININE, CALCIUM in the last 72 hours. PT/INR No results for input(s): LABPROT, INR in the last 72 hours. Comprehensive Metabolic Panel:    Component Value Date/Time   NA 139 03/04/2016 1437   K 4.1 03/04/2016 1437   CL 104 03/04/2016 1437   CO2 27 03/04/2016 1437   BUN 12 03/04/2016 1437   CREATININE 0.74 03/04/2016 1437   GLUCOSE 99 03/04/2016 1437   CALCIUM 9.5 03/04/2016 1437   AST 34 03/04/2016 1437   ALT 44 03/04/2016 1437   ALKPHOS 112 03/04/2016 1437   BILITOT 0.4 03/04/2016 1437   PROT 6.8 03/04/2016 1437   ALBUMIN 4.3 03/04/2016 1437     Studies/Results: No results found.  Anti-infectives: Anti-infectives    Start     Dose/Rate Route Frequency Ordered Stop   03/08/16 2000  ceFAZolin (ANCEF) IVPB 2g/100 mL premix     2 g 200  mL/hr over 30 Minutes Intravenous Every 8 hours 03/08/16 1418 03/08/16 2049   03/08/16 0938  ceFAZolin (ANCEF) 2-4 GM/100ML-% IVPB    Comments:  Domenica FailJones, Tomika   : cabinet override      03/08/16 0938 03/08/16 1116   03/08/16 0932  ceFAZolin (ANCEF) IVPB 2g/100 mL premix     2 g 200 mL/hr over 30 Minutes Intravenous On call to O.R. 03/08/16 0932 03/08/16 1116       Rakan Soffer J 03/09/2016

## 2016-03-10 DIAGNOSIS — K436 Other and unspecified ventral hernia with obstruction, without gangrene: Secondary | ICD-10-CM | POA: Diagnosis not present

## 2016-03-10 NOTE — Progress Notes (Signed)
Pt walked the unit with husband without difficulties. No destress noted.

## 2016-03-10 NOTE — Progress Notes (Signed)
IV removed. Pt left facility via wheelchair with no signs of distress. Pt verbalized discharge instructions.

## 2016-03-10 NOTE — Progress Notes (Signed)
Assessment Active Problems:   Chronically incarcerated ventral hernia s/p laparoscopic repair with mesh 03/08/16-doing much better.  Plan:  Discharge today.  Instructions discussed with her.   LOS: 0 days     2 Days Post-Op  Subjective: Feel much better.  Adequate pain control now with oral analgesic.  Passing gas.  Objective: Vital signs in last 24 hours: Temp:  [98.3 F (36.8 C)-98.6 F (37 C)] 98.3 F (36.8 C) (12/06 0654) Pulse Rate:  [52-75] 71 (12/06 0654) Resp:  [17-18] 18 (12/06 0654) BP: (121-140)/(56-71) 136/64 (12/06 0654) SpO2:  [91 %-97 %] 91 % (12/06 0654) Last BM Date: 03/08/16  Intake/Output from previous day: 12/05 0701 - 12/06 0700 In: 1880 [P.O.:1880] Out: 3350 [Urine:3350] Intake/Output this shift: No intake/output data recorded.  PE: General- In NAD Abdomen-soft,, dressings dry  Lab Results:  No results for input(s): WBC, HGB, HCT, PLT in the last 72 hours. BMET No results for input(s): NA, K, CL, CO2, GLUCOSE, BUN, CREATININE, CALCIUM in the last 72 hours. PT/INR No results for input(s): LABPROT, INR in the last 72 hours. Comprehensive Metabolic Panel:    Component Value Date/Time   NA 139 03/04/2016 1437   K 4.1 03/04/2016 1437   CL 104 03/04/2016 1437   CO2 27 03/04/2016 1437   BUN 12 03/04/2016 1437   CREATININE 0.74 03/04/2016 1437   GLUCOSE 99 03/04/2016 1437   CALCIUM 9.5 03/04/2016 1437   AST 34 03/04/2016 1437   ALT 44 03/04/2016 1437   ALKPHOS 112 03/04/2016 1437   BILITOT 0.4 03/04/2016 1437   PROT 6.8 03/04/2016 1437   ALBUMIN 4.3 03/04/2016 1437     Studies/Results: No results found.  Anti-infectives: Anti-infectives    Start     Dose/Rate Route Frequency Ordered Stop   03/08/16 2000  ceFAZolin (ANCEF) IVPB 2g/100 mL premix     2 g 200 mL/hr over 30 Minutes Intravenous Every 8 hours 03/08/16 1418 03/08/16 2049   03/08/16 0938  ceFAZolin (ANCEF) 2-4 GM/100ML-% IVPB    Comments:  Domenica FailJones, Tomika   : cabinet override       03/08/16 0938 03/08/16 1116   03/08/16 0932  ceFAZolin (ANCEF) IVPB 2g/100 mL premix     2 g 200 mL/hr over 30 Minutes Intravenous On call to O.R. 03/08/16 0932 03/08/16 1116       Javarus Dorner J 03/10/2016

## 2016-03-10 NOTE — Progress Notes (Signed)
Pt received discharge prescription and medical equipment.

## 2016-03-10 NOTE — Discharge Summary (Signed)
Physician Discharge Summary  Patient ID: Sarah Ortega MRN: [id number] DOB/AGE: 48/04/1965 48 y.o.  Admit date: 03/08/2016 Discharge date: 03/10/2016  Admission Diagnoses:  Ventral hernia  Discharge Diagnoses:  Active Problems:   Incarcerated ventral hernia s/p laparoscopic repair with mesh 03/08/16   Discharged Condition: good  Hospital Course: She underwent the above procedure on 03/08/16.  Her first postop day she required IV analgesics for adequate pain control.  By her second postop day, she was feeling much better and her pain was adequately controlled with oral analgesics.  She was able to be discharged.  Instructions were given to her.   Discharge Exam: Blood pressure 136/64, pulse 71, temperature 98.3 F (36.8 C), temperature source Oral, resp. rate 18, height 5\' 3"  (1.6 m), weight 96.6 kg (213 lb), SpO2 91 %.   Disposition: Final discharge disposition not confirmed     Medication List    TAKE these medications   amLODipine 10 MG tablet Commonly known as:  NORVASC Take 5 mg by mouth every evening.   atenolol 25 MG tablet Commonly known as:  TENORMIN Take 12.5 mg by mouth daily.   atorvastatin 10 MG tablet Commonly known as:  LIPITOR Take 10 mg by mouth daily.   CALCIUM-MAGNESIUM-VITAMIN D PO Take 1 tablet by mouth daily.   cetirizine 10 MG tablet Commonly known as:  ZYRTEC Take 10 mg by mouth daily.   Iron 325 (65 Fe) MG Tabs Take 1 tablet by mouth daily.   multivitamin with minerals tablet Take 1 tablet by mouth daily.   oxyCODONE 5 MG immediate release tablet Commonly known as:  Oxy IR/ROXICODONE Take 1-2 tablets (5-10 mg total) by mouth every 4 (four) hours as needed for moderate pain.        Signed: Adolph PollackOSENBOWER,Kayliana Codd J 03/10/2016, 8:34 AM

## 2018-02-13 IMAGING — CT CT ABD-PELV W/ CM
2 of 6 series · 16 of 46 positions shown, 18 images · IV contrast (iopamidol)
Comparison: None.

CLINICAL DATA: Chronic right lower quadrant bulge, progressed since
9589

EXAM:
CT ABDOMEN AND PELVIS WITH CONTRAST
TECHNIQUE: Multidetector CT imaging of the abdomen and pelvis was performed
using the standard protocol following bolus administration of
intravenous contrast.
CONTRAST:  100mL EBRWAV-NWW IOPAMIDOL (EBRWAV-NWW) INJECTION 61%

[Series 2: routine abd pel with · axial · 0.81mm/px · z∈[-470,-26]mm · 13 of 103 slices shown, 15 images]
[im 7/103  soft-tissue]
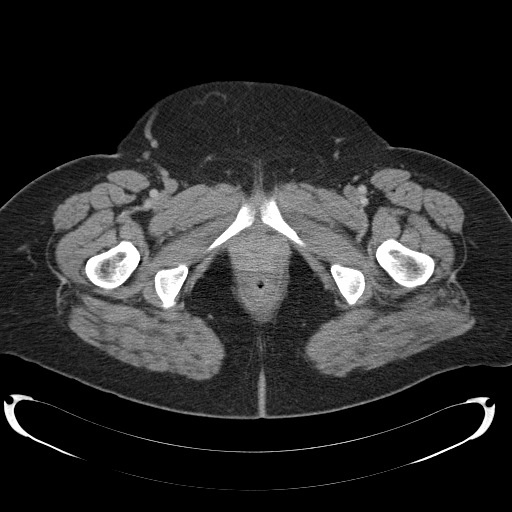
[im 7/103  bone]
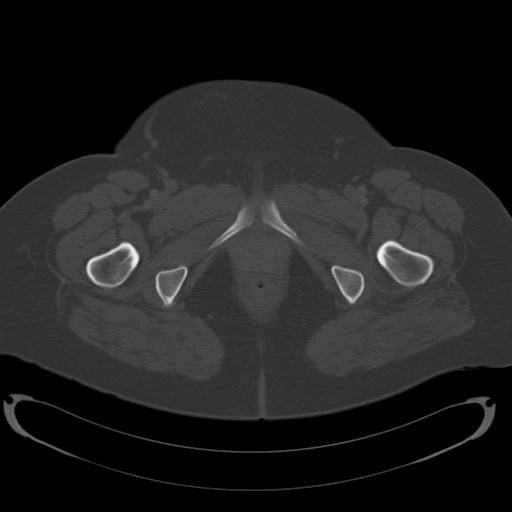
[im 13/103  soft-tissue]
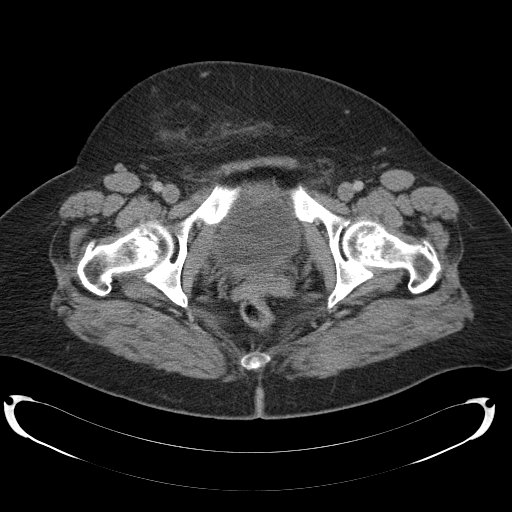
[im 20/103  soft-tissue]
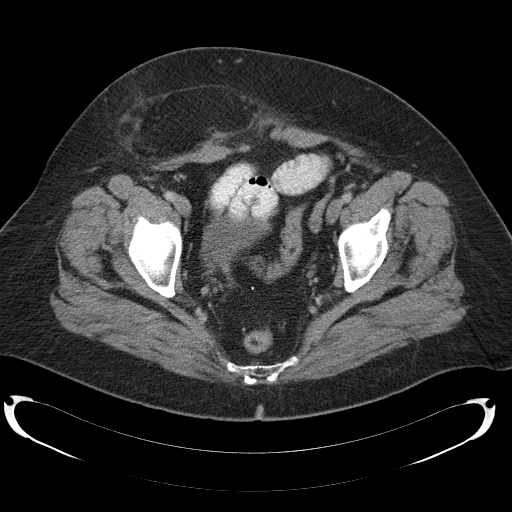
[im 32/103  soft-tissue]
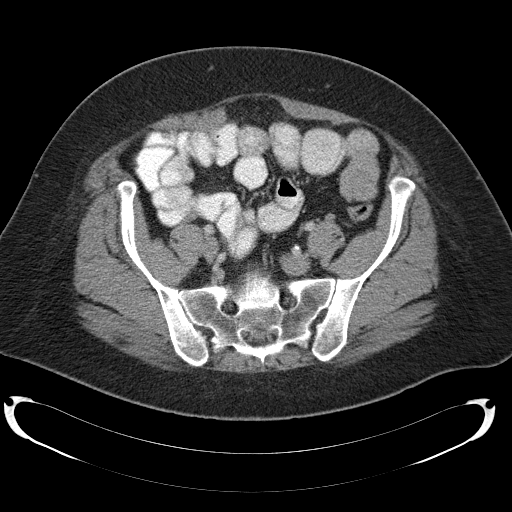
[im 39/103  soft-tissue]
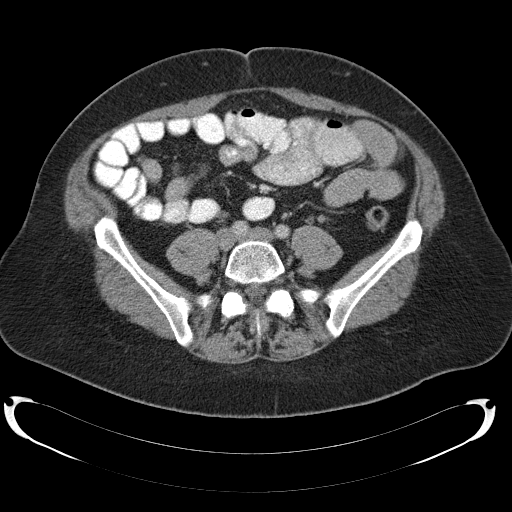
[im 45/103  soft-tissue]
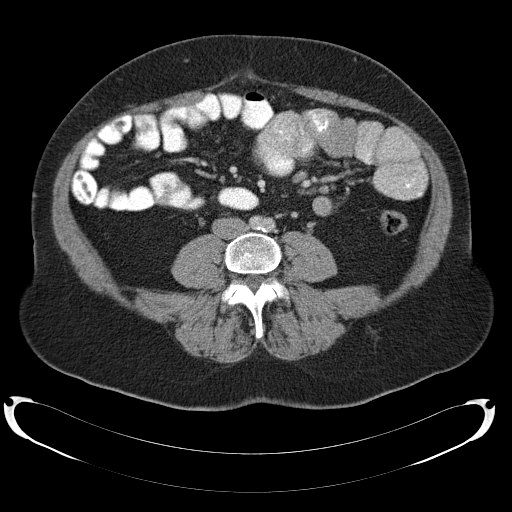
[im 52/103  soft-tissue]
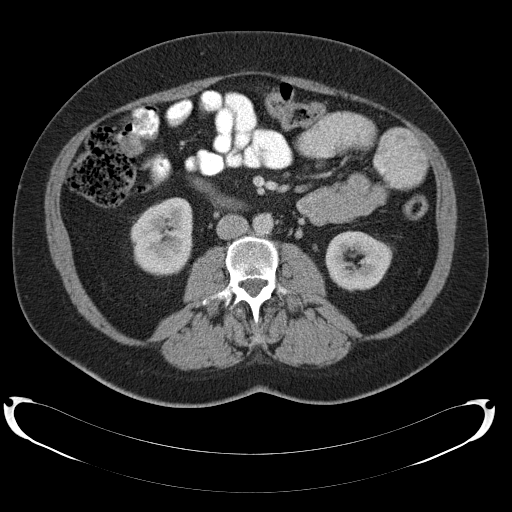
[im 58/103  soft-tissue]
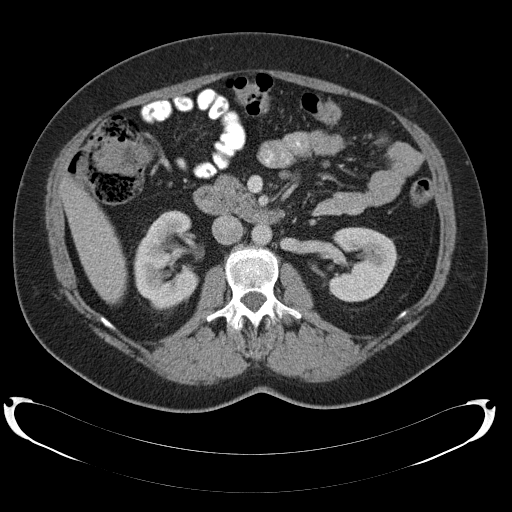
[im 64/103  soft-tissue]
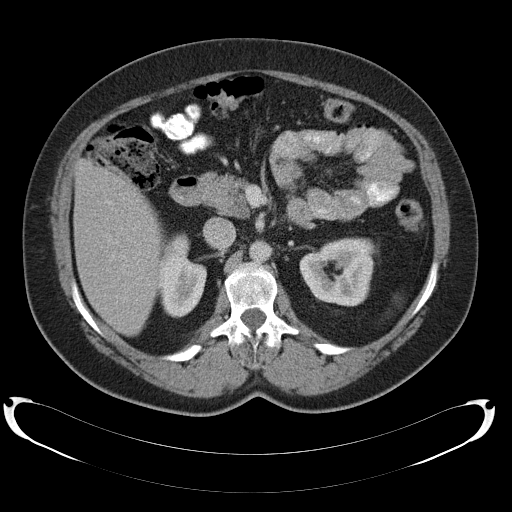
[im 64/103  bone]
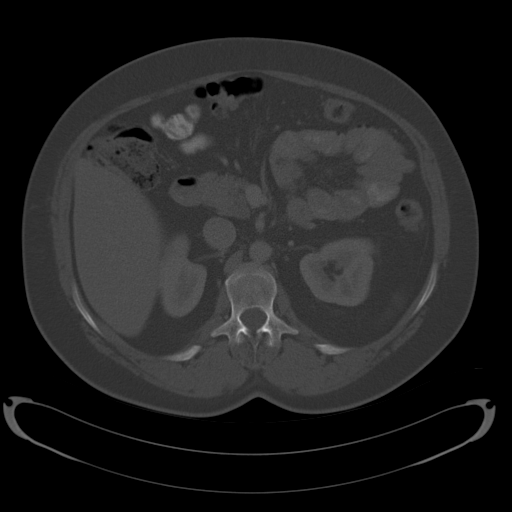
[im 71/103  soft-tissue]
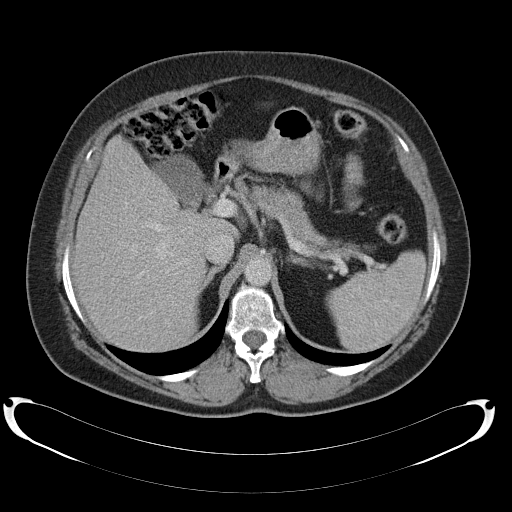
[im 83/103  soft-tissue]
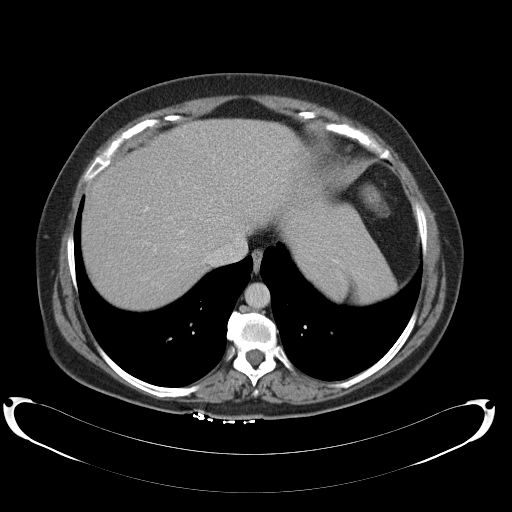
[im 90/103  soft-tissue]
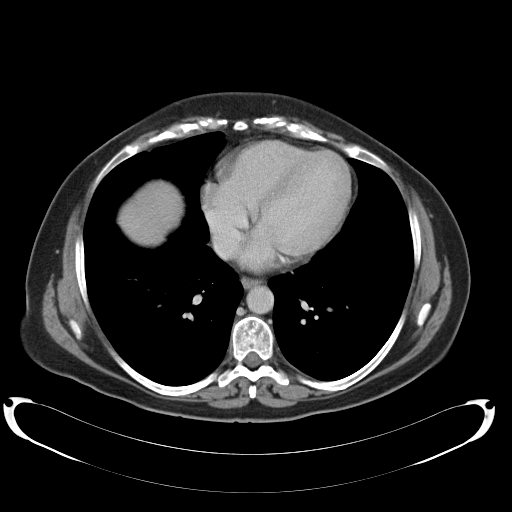
[im 96/103  soft-tissue]
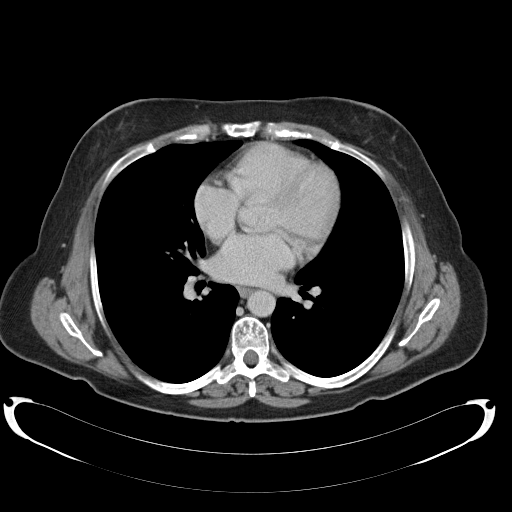

[Series 5: coronal · coronal · 0.84mm/px · 3 of 145 slices shown]
[im 49/145  soft-tissue]
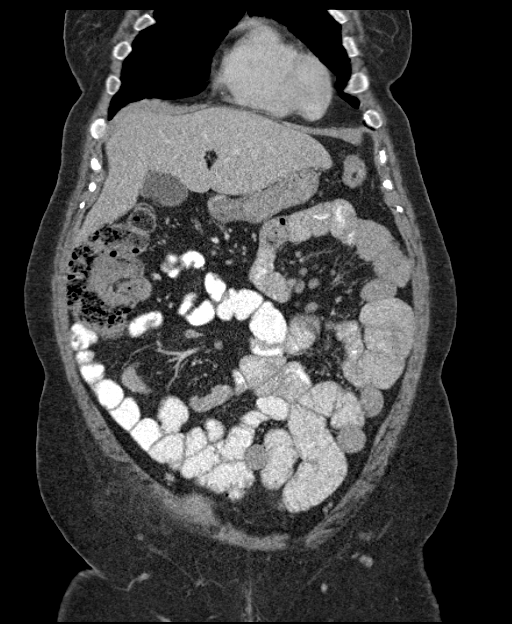
[im 65/145  soft-tissue]
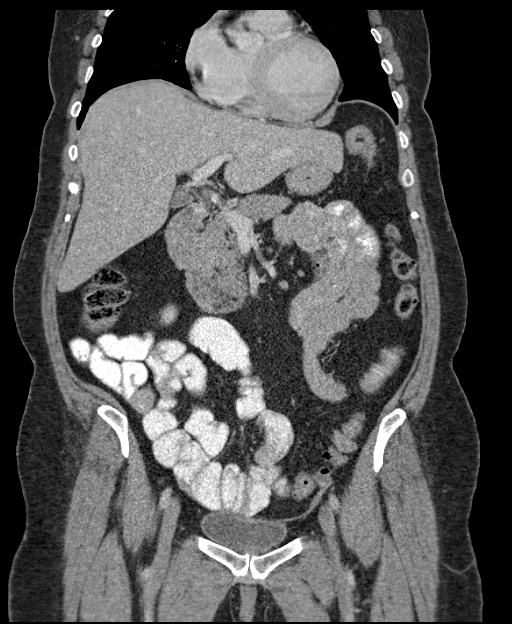
[im 81/145  soft-tissue]
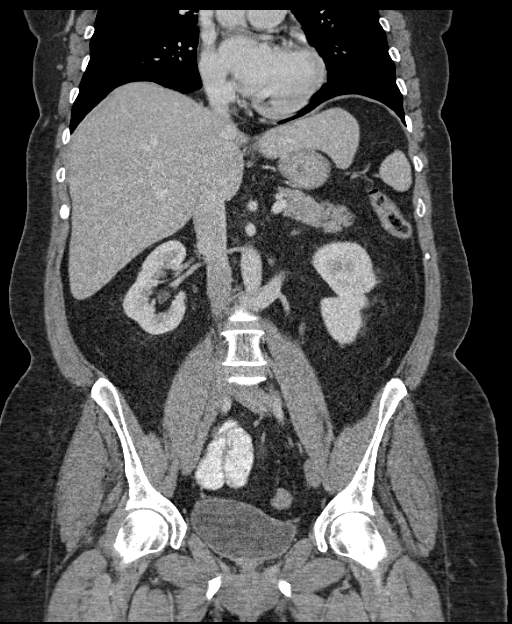

[16 of 46 positions shown; findings below may reference images not displayed]

FINDINGS: Lower chest:  Lung bases are clear.

Hepatobiliary: Liver is within normal limits.

Gallbladder is unremarkable. No intrahepatic or extrahepatic ductal
dilatation.

Pancreas: Within normal limits.

Spleen: Calcified subcapsular granuloma. Otherwise within normal
limits.

Adrenals/Urinary Tract: Adrenal glands are within normal limits.

Kidneys are within normal limits.  No hydronephrosis.

Bladder is within normal limits.

Stomach/Bowel: Stomach is within normal limits.

No evidence of bowel obstruction.

Normal appendix (series 2/ image 53).

Vascular/Lymphatic: No evidence of abdominal aortic aneurysm.

Retro aortic left renal vein.

No suspicious abdominopelvic lymphadenopathy.

Reproductive: Status post hysterectomy.

Bilateral ovaries are within normal limits.

Other: No abdominopelvic ascites.

Moderate fat containing right lower paramidline ventral hernia
extending into the right lower abdominal wall (series 2/image 82;
coronal image 32), likely corresponding to the palpable abnormality.

Musculoskeletal: Visualized osseous structures are within normal
limits.
IMPRESSION: Moderate fat containing right lower paramidline ventral hernia
extending into the right lower abdominal wall, likely corresponding
to the palpable abnormality.

No evidence of bowel obstruction.  Normal appendix.
